# Patient Record
Sex: Female | Born: 1970 | Race: White | Hispanic: No | Marital: Married | Smoking: Former smoker
Health system: Southern US, Community
[De-identification: ages and names within clinical notes are randomized; demographics above are authoritative.]

## PROBLEM LIST (undated history)

## (undated) DIAGNOSIS — M5126 Other intervertebral disc displacement, lumbar region: Secondary | ICD-10-CM

## (undated) DIAGNOSIS — N390 Urinary tract infection, site not specified: Secondary | ICD-10-CM

## (undated) HISTORY — DX: Urinary tract infection, site not specified: N39.0

## (undated) HISTORY — PX: ABDOMINAL HYSTERECTOMY: SHX81

## (undated) HISTORY — DX: Other intervertebral disc displacement, lumbar region: M51.26

---

## 2007-12-22 HISTORY — PX: ABDOMINOPLASTY: SUR9

## 2015-09-22 ENCOUNTER — Emergency Department (HOSPITAL_COMMUNITY)
Admission: EM | Admit: 2015-09-22 | Discharge: 2015-09-22 | Disposition: A | Discharge status: Home / Self Care | Payer: Managed Care, Other (non HMO) | Attending: Emergency Medicine | Admitting: Emergency Medicine

## 2015-09-22 ENCOUNTER — Encounter (HOSPITAL_COMMUNITY): Payer: Self-pay | Admitting: Emergency Medicine

## 2015-09-22 ENCOUNTER — Emergency Department (HOSPITAL_COMMUNITY): Payer: Managed Care, Other (non HMO)

## 2015-09-22 DIAGNOSIS — R1012 Left upper quadrant pain: Secondary | ICD-10-CM | POA: Diagnosis not present

## 2015-09-22 DIAGNOSIS — R102 Pelvic and perineal pain: Secondary | ICD-10-CM | POA: Insufficient documentation

## 2015-09-22 DIAGNOSIS — R197 Diarrhea, unspecified: Secondary | ICD-10-CM | POA: Diagnosis not present

## 2015-09-22 DIAGNOSIS — R109 Unspecified abdominal pain: Secondary | ICD-10-CM

## 2015-09-22 LAB — COMPREHENSIVE METABOLIC PANEL
ALBUMIN: 4.4 g/dL (ref 3.5–5.0)
ALT: 14 U/L (ref 14–54)
ANION GAP: 8 (ref 5–15)
AST: 18 U/L (ref 15–41)
Alkaline Phosphatase: 42 U/L (ref 38–126)
BILIRUBIN TOTAL: 1.5 mg/dL — AB (ref 0.3–1.2)
BUN: 9 mg/dL (ref 6–20)
CALCIUM: 9.5 mg/dL (ref 8.9–10.3)
CO2: 29 mmol/L (ref 22–32)
Chloride: 102 mmol/L (ref 101–111)
Creatinine, Ser: 0.74 mg/dL (ref 0.44–1.00)
GFR calc non Af Amer: >60 mL/min (ref >60)
GLUCOSE: 96 mg/dL (ref 65–99)
POTASSIUM: 3.7 mmol/L (ref 3.5–5.1)
SODIUM: 139 mmol/L (ref 135–145)
TOTAL PROTEIN: 6.9 g/dL (ref 6.5–8.1)

## 2015-09-22 LAB — URINALYSIS, ROUTINE W REFLEX MICROSCOPIC
Bilirubin Urine: NEGATIVE
Glucose, UA: NEGATIVE mg/dL
Hgb urine dipstick: NEGATIVE
Ketones, ur: NEGATIVE mg/dL
LEUKOCYTES UA: NEGATIVE
NITRITE: NEGATIVE
PROTEIN: NEGATIVE mg/dL
SPECIFIC GRAVITY, URINE: 1.022 (ref 1.005–1.030)
UROBILINOGEN UA: 0.2 mg/dL (ref 0.0–1.0)
pH: 6 (ref 5.0–8.0)

## 2015-09-22 LAB — CBC WITH DIFFERENTIAL/PLATELET
BASOS ABS: 0 10*3/uL (ref 0.0–0.1)
BASOS PCT: 0 %
Eosinophils Absolute: 0.1 10*3/uL (ref 0.0–0.7)
Eosinophils Relative: 2 %
HEMATOCRIT: 41.6 % (ref 36.0–46.0)
HEMOGLOBIN: 14.1 g/dL (ref 12.0–15.0)
LYMPHS PCT: 35 %
Lymphs Abs: 1.7 10*3/uL (ref 0.7–4.0)
MCH: 30.6 pg (ref 26.0–34.0)
MCHC: 33.9 g/dL (ref 30.0–36.0)
MCV: 90.2 fL (ref 78.0–100.0)
MONO ABS: 0.5 10*3/uL (ref 0.1–1.0)
Monocytes Relative: 9 %
NEUTROS PCT: 54 %
Neutro Abs: 2.7 10*3/uL (ref 1.7–7.7)
Platelets: 242 10*3/uL (ref 150–400)
RBC: 4.61 MIL/uL (ref 3.87–5.11)
RDW: 12 % (ref 11.5–15.5)
WBC: 5 10*3/uL (ref 4.0–10.5)

## 2015-09-22 LAB — C DIFFICILE QUICK SCREEN W PCR REFLEX
C DIFFICILE (CDIFF) INTERP: NEGATIVE
C DIFFICLE (CDIFF) ANTIGEN: NEGATIVE
C Diff toxin: NEGATIVE

## 2015-09-22 LAB — LIPASE, BLOOD: Lipase: 22 U/L (ref 22–51)

## 2015-09-22 MED ORDER — CIPROFLOXACIN HCL 500 MG PO TABS: 500.0000 mg | ORAL_TABLET | Freq: Two times a day (BID) | ORAL | Status: DC

## 2015-09-22 MED ORDER — IOHEXOL 300 MG/ML  SOLN
25.0000 mL | Freq: Once | INTRAMUSCULAR | Status: AC | PRN
  Administered 2015-09-22: 25 mL via ORAL

## 2015-09-22 MED ORDER — METRONIDAZOLE 500 MG PO TABS
500.0000 mg | ORAL_TABLET | Freq: Three times a day (TID) | ORAL | Status: DC
Start: 2015-09-22 — End: 2016-06-23

## 2015-09-22 MED ORDER — IOHEXOL 300 MG/ML  SOLN
100.0000 mL | Freq: Once | INTRAMUSCULAR | Status: AC | PRN
  Administered 2015-09-22: 100 mL via INTRAVENOUS

## 2015-09-22 MED ORDER — SODIUM CHLORIDE 0.9 % IV SOLN
Freq: Once | INTRAVENOUS | Status: AC
  Administered 2015-09-22: 08:00:00 via INTRAVENOUS

## 2015-09-22 NOTE — Discharge Instructions (Signed)
You have come to the ED today for abdominal pain and diarrhea. It is suspected that you have an enteritis. You will be given antibiotics to treat this. You will be notified of your C-Diff results if they are positive.  A resource guide is included for you to choose a PCP as needed.   Please return to ED should symptoms worsen.

## 2015-09-22 NOTE — ED Notes (Signed)
Pt drinking oral contrast. Instructed to call when she is finished.

## 2015-09-22 NOTE — ED Notes (Signed)
Patient transported to CT 

## 2015-09-22 NOTE — ED Notes (Signed)
A week ago Thursday started having abd pain, pressure, bloating. In LUQ and extends to groin. Denies N/V, reports occasional diarrhea. Family MD started on Augmentin, no improvement.

## 2015-09-22 NOTE — ED Notes (Signed)
Patient returned from CT

## 2015-09-22 NOTE — ED Notes (Signed)
Finished with PO contrast.

## 2015-09-22 NOTE — ED Provider Notes (Signed)
CSN: [id number]     Arrival date & time 09/22/15  0709 History   First MD Initiated Contact with Patient 09/22/15 [phone]     Chief Complaint  Patient presents with  . Abdominal Pain     Patient is a 44 y.o. female presenting with abdominal pain. The history is provided by the patient.  Abdominal Pain Pain location:  LUQ Pain quality: bloating, cramping, fullness and gnawing   Pain radiates to:  Groin Pain severity:  Moderate Onset quality:  Sudden Duration:  11 days Timing:  Constant Progression:  Unchanged Chronicity:  New Context: trauma   Context: not awakening from sleep, not diet changes, not recent travel, not sick contacts and not suspicious food intake   Relieved by: Gas-X reduced bloating somewhat. Worsened by:  Eating Associated symptoms: diarrhea   Associated symptoms: no belching, no chest pain, no chills, no constipation, no cough, no dysuria, no fatigue, no fever, no hematemesis, no hematochezia, no hematuria, no nausea, no shortness of breath, no vaginal bleeding, no vaginal discharge and no vomiting     Danielle Kramer is a 44 y.o. female presents with LUQ abdominal pain that began Thursday, September 22 when she woke up. Pain extends down into the groin, is constant at about 4/10, and is accompanied by diarrhea and feelings of being bloated and "rumbling." Pt denies any N/V/D, hematochezia, abdominal distention, chest pain, shortness of breath, or any other complaints. Pt has a previous oophorectomy of her right ovary as well as a partial hysterectomy. Pt was seen on Sept 27 (Tues) at the [facility], saw the NP on call, and received a abdominal and pelvic ultrasound, which showed a normal left ovary as well as evidence of an inflamed colon. Pt was then seen by a primary care provider on Thursday, Sept 29, was diagnosed with "possible diverticulitis" and given Augmentin. After almost 4 days on Augmentin pt states she feels no better. Pt had a colonoscopy two years ago due to  some rectal bleeding. Colonoscopy was clear other than minor hemorrhoids.    History reviewed. No pertinent past medical history. Past Surgical History  Procedure Laterality Date  . Abdominal hysterectomy    . Abdominoplasty     History reviewed. No pertinent family history. Social History  Substance Use Topics  . Smoking status: Never Smoker   . Smokeless tobacco: None  . Alcohol Use: Yes   OB History    No data available     Review of Systems  Constitutional: Negative for fever, chills and fatigue.  Respiratory: Negative for cough, chest tightness and shortness of breath.   Cardiovascular: Negative for chest pain, palpitations and leg swelling.  Gastrointestinal: Positive for abdominal pain and diarrhea. Negative for nausea, vomiting, constipation, blood in stool, hematochezia, abdominal distention and hematemesis.  Genitourinary: Positive for pelvic pain. Negative for dysuria, urgency, hematuria, flank pain, decreased urine volume, vaginal bleeding, vaginal discharge, difficulty urinating and vaginal pain.      Allergies  Review of patient's allergies indicates no known allergies.  Home Medications   Prior to Admission medications   Medication Sig Start Date End Date Taking? Authorizing Provider  ciprofloxacin (CIPRO) 500 MG tablet Take 1 tablet (500 mg total) by mouth 2 (two) times daily. 09/22/15   Elija Mccamish C Alleigh Mollica, PA-C  metroNIDAZOLE (FLAGYL) 500 MG tablet Take 1 tablet (500 mg total) by mouth 3 (three) times daily. 09/22/15   Katelynne Revak C Renezmae Canlas, PA-C   BP 117/82 mmHg  Pulse 82  Temp(Src) 98.1 F (36.7  C) (Oral)  Resp 20  Ht  (1.727 m)  Wt 158 lb (71.668 kg)  BMI 24.03 kg/m2  SpO2 100% Physical Exam  Constitutional: She is oriented to person, place, and time. She appears well-developed and well-nourished. No distress.  HENT:  Head: Normocephalic and atraumatic.  Eyes: Conjunctivae and EOM are normal. Pupils are equal, round, and reactive to light.  Cardiovascular:  Normal rate, regular rhythm and normal heart sounds.   Pulmonary/Chest: Effort normal and breath sounds normal. No respiratory distress.  Abdominal: Soft. Normal appearance and bowel sounds are normal. She exhibits no distension and no mass. There is tenderness in the left upper quadrant. No hernia.  Very mild tenderness in LUQ with no guarding. No hernia appreciated.  Musculoskeletal: She exhibits no edema or tenderness.  Neurological: She is alert and oriented to person, place, and time.  Skin: Skin is warm and dry. She is not diaphoretic.  Nursing note and vitals reviewed.   ED Course  Procedures (including critical care time) Labs Review Labs Reviewed  COMPREHENSIVE METABOLIC PANEL - Abnormal; Notable for the following:    Total Bilirubin 1.5 (*)    All other components within normal limits  URINALYSIS, ROUTINE W REFLEX MICROSCOPIC (NOT AT [facility]) - Abnormal; Notable for the following:    Color, Urine AMBER (*)    APPearance CLOUDY (*)    All other components within normal limits  C DIFFICILE QUICK SCREEN W PCR REFLEX  CBC WITH DIFFERENTIAL/PLATELET  LIPASE, BLOOD    Imaging Review Ct Abdomen Pelvis W Contrast  09/22/2015   CLINICAL DATA:  Ten day history of left left-sided abdominal pain and pressure. Intermittent diarrhea.  EXAM: CT ABDOMEN AND PELVIS WITH CONTRAST  TECHNIQUE: Multidetector CT imaging of the abdomen and pelvis was performed using the standard protocol following bolus administration of intravenous contrast. Oral contrast was also administered.  CONTRAST:  OMNIPAQUE IOHEXOL 300 MG/ML  SOLN  COMPARISON:  None.  FINDINGS: Lower chest: There is slight bibasilar lung atelectatic change. Lung bases are otherwise clear.  Hepatobiliary: There is a 1.6 x 1.6 cm mass in the dome of the liver in the medial segment left lobe region, likely a hemangioma. There is a second mass in the liver, more inferiorly located in the lateral segment left lobe measuring 1.4 x 1.0 cm.  This mass appears unchanged on slightly delayed images compared to the venous phase images initially obtained. No other focal liver lesions are identified. Gallbladder wall is not thickened. There is no biliary duct dilatation.  Pancreas: No mass or inflammatory change.  Spleen: No lesion identified.  Adrenals/Urinary Tract: Adrenals appear normal bilaterally. Kidneys bilaterally show no mass or hydronephrosis on either side. There is no renal or ureteral calculus on either side. Urinary bladder is midline with normal wall thickness.  Stomach/Bowel: Most bowel loops are fluid filled. There is no bowel wall or mesenteric thickening. No bowel obstruction. No free air or portal venous air. There are scattered sigmoid and descending colonic diverticula without diverticulitis.  Vascular/Lymphatic: There is no abdominal aortic aneurysm. There is no adenopathy in the abdomen or pelvis.  Reproductive: Uterus by report is absent. There is prominence in the region of the cervix with fluid either in the cervix or in a residual lower uterine segmental remnant. There is no well-defined pelvic mass or pelvic fluid collection.  Other: Appendix appears unremarkable. No abscess or ascites in the abdomen or pelvis. There is a rather minimal ventral hernia containing only fat.  Musculoskeletal: There  are no blastic or lytic bone lesions. No intramuscular lesion or abdominal wall lesion.  IMPRESSION: Fluid either within the endocervical canal or within a lower uterine segmental remnant. Correlation with pelvic examination advised given this appearance.  Most bowel loops are fluid-filled. This finding may be seen normally but also may be seen with a degree of enteritis. Note that there is no bowel wall or mesenteric thickening. No bowel obstruction.  Liver lesions, most likely representing hemangiomas. As there are no prior studies to compare, it may be reasonable to correlate with pre and serial post-contrast CT or MR to further  evaluate these lesions. MR would be the optimum study for this evaluation.  No renal or ureteral calculus.  No hydronephrosis.  There is a minimal ventral hernia containing only fat.   Electronically Signed   By: Bretta Bang III M.D.   On: 09/22/2015 10:22   I have personally reviewed and evaluated these images and lab results as part of my medical decision-making.   EKG Interpretation None      MDM   Final diagnoses:  Abdominal pain in female  Diarrhea, unspecified type    Danielle Kramer presents with LUQ abdominal pain for greater than a week without fever, N/V, or bleeding.  Findings and plan of care discussed with Dr. Anitra Lauth.  Will obtain abdominal and pelvic CT, labs, and monitor pain. Pt offered pain medication, but declined. Pt states she will advise provider should she change her mind.  Unless imaging results indicate otherwise, pt will be changed from Augmentin to a combination of Cipro and Flagyl.  8:23 AM CBC shows no evidence of acute hemorrhage or systemic infection. Pt still denies need for pain medication.  9:08 AM CMP and lipase both without abnormality.  9:23 AM Pt states her pain is now only limited to LUQ, is still about a 4/10, and declines pain medication. Pt has finished oral contrast and is awaiting CT.  10:30 AM CT shows indications of possible enteritis with no obstruction, masses, bowel wall thickening, or diverticulitis. Hepatic hemangiomas noted. Recommended pre and serial post-contrast CT or MR to further evaluate lesions and give comparison. MR is the recommended study for this problem.   Will communicate this to patient and advise that she followup with PCP to have these hemangiomas evaluated and monitored.  Pt states her son had C-Diff in April, but had no other exposures to C-Diff. Pt will try to give a stool sample.  Pt able to give stool sample. Will discharge patient and followup with results via phone call.  Anselm Pancoast,  PA-C 09/22/15 1138  Geoffery Lyons, MD 09/22/15 2258

## 2016-06-04 ENCOUNTER — Ambulatory Visit (INDEPENDENT_AMBULATORY_CARE_PROVIDER_SITE_OTHER): Payer: PRIVATE HEALTH INSURANCE | Admitting: Sports Medicine

## 2016-06-04 ENCOUNTER — Encounter: Payer: Self-pay | Admitting: Sports Medicine

## 2016-06-04 VITALS — BP 120/83 | HR 85 | Ht 68.0 in | Wt 165.0 lb

## 2016-06-04 DIAGNOSIS — R5383 Other fatigue: Secondary | ICD-10-CM

## 2016-06-04 MED ORDER — AMITRIPTYLINE HCL 25 MG PO TABS: 25.0000 mg | ORAL_TABLET | Freq: Every day | ORAL | Status: DC

## 2016-06-04 NOTE — Progress Notes (Signed)
  Danielle Kramer - 45 y.o. female MRN [id number]  Date of birth: 04/16/1971 Danielle ConroyKari Kramer is a 45 y.o. female who presents today for joint pain.  Polyarthralgia, initial visit, 06/04/16-patient presents today with ongoing polyarthralgias for about 1 year. She was an avid [employer]/shoulder prior to this with no joint pain. When she stopped this about 1 year ago she started developing polyarthralgias in her knees ankles and's hips shoulders. She denies any changes in her medications travel or sick contacts or being sick at that time. Denies any fevers chills night sweats or unintentional weight loss. This fluctuated over the last several months and she has seen 2 previous doctors who have done multiple labs on her including sedimentation rate, CRP, comprehensive metabolic profile, thyroid-stimulating hormone, CBC, ANA, rheumatoid factor, anti-CCP, estrogen and testosterone, CK all of which have been normal 2. Her joint pain at this point is not limiting but she has trouble sleeping. No headaches blurred vision or double vision. She has not tried medication at this point for this.  PMHx - Updated and reviewed.  Contributory factors include: Negative PSHx - Updated and reviewed.  Contributory factors include:  Abdominal hysterectomy FHx - Updated and reviewed.  Contributory factors include:  Negative Social Hx - Updated and reviewed. Contributory factors include: Nonsmoker  Medications - reviewed   ROS Per HPI.  12 point negative other than per HPI.   Exam:  Filed Vitals:   06/04/16 0954  BP: 120/83  Pulse: 85   Gen: NAD, AAO 3 Cardio- RRR Pulm - Normal respiratory effort/rate Skin: No rashes or erythema Extremities: No edema  Vascular: pulses +2 bilateral upper and lower extremity Psych: Normal affect  MSK: No joint effusions noted. No erythema or ecchymosis. She has full range of motion of all joints with no tenderness to palpation. Grossly neurovascularly intact bilateral upper extremity and lower  extremity.  Imaging:  None

## 2016-06-04 NOTE — Assessment & Plan Note (Signed)
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 Trial of Elavil along with anti-inflammatory nutrition and light training. She will follow-up in 6-8 weeks to see how she is doing.

## 2016-06-17 ENCOUNTER — Encounter: Payer: Self-pay | Admitting: Neurology

## 2016-06-17 ENCOUNTER — Ambulatory Visit (INDEPENDENT_AMBULATORY_CARE_PROVIDER_SITE_OTHER): Payer: PRIVATE HEALTH INSURANCE | Admitting: Neurology

## 2016-06-17 VITALS — BP 128/86 | HR 79 | Ht 68.0 in | Wt 164.0 lb

## 2016-06-17 DIAGNOSIS — R251 Tremor, unspecified: Secondary | ICD-10-CM | POA: Diagnosis not present

## 2016-06-17 MED ORDER — AMITRIPTYLINE HCL 10 MG PO TABS: 10.0000 mg | ORAL_TABLET | Freq: Every day | ORAL | Status: DC

## 2016-06-17 NOTE — Progress Notes (Signed)
Reason for visit: Tremor  Referring physician: Dr. Lucilla LameWatson  Danielle Kramer is a 45 y.o. female  History of present illness:  Danielle Kramer is a 45 year old right-handed white female with a history of onset of total body burning sensations that began in early April 2017. The patient felt very poorly for about 7 days, then the burning sensations seemed to clear, and the patient began having popping sensations and joint discomfort, particularly in the hips and shoulders. The patient denied any fevers, chills, skin rashes, or joint swellings. The patient has had some internal sensations of tremor that are most notable when she is using the arms or legs, particularly with putting pressure down on something with her hands. The patient denies any true weakness. She denies issues with balance or trouble controlling the bowels or the bladder. The patient did have some headaches and neck discomfort early on in the clinical syndrome, but this has resolved. The patient has been seen by several doctors, she has had extensive blood work done to include a comprehensive metabolic profile, iron panel, CBC, C-reactive protein, sedimentation rate, thyroid profile, thyroid peroxidase antibody, thyroglobulin antibody, vitamin B12 level, ANA, rheumatoid factor, Lyme antibody panel, vitamin D level, and testosterone level. These studies have all been normal. The patient is trying to get back into exercise slowly. She was exercising vigorously prior to the onset of symptoms, the patient had stopped exercising suddenly and then the symptoms came on. The patient is sent to this office for further evaluation.  Past Medical History  Diagnosis Date  . Lumbar herniated disc     L4/5  . Frequent UTI     Past Surgical History  Procedure Laterality Date  . Abdominal hysterectomy  2008, 2010  . Abdominoplasty  2009    Family History  Problem Relation Age of Onset  . Crohn's disease Father   . Bladder Cancer Father   .  Hepatitis Mother     Autoimmune  . Hyperthyroidism Mother     Social history:  reports that she quit smoking about 28 years ago. She does not have any smokeless tobacco history on file. She reports that she drinks alcohol. She reports that she does not use illicit drugs.  Medications:  Prior to Admission medications   Medication Sig Start Date End Date Taking? Authorizing Provider  Cholecalciferol (VITAMIN D PO) Take by mouth daily.   Yes Historical Provider, MD  ibuprofen (ADVIL,MOTRIN) 800 MG tablet Take 800 mg by mouth.   Yes Historical Provider, MD  Omega-3 Fatty Acids (FISH OIL PO) Take by mouth daily.   Yes Historical Provider, MD  amitriptyline (ELAVIL) 10 MG tablet Take 1 tablet (10 mg total) by mouth at bedtime. 06/17/16   York Spanielharles K Candance Bohlman, MD  ciprofloxacin (CIPRO) 500 MG tablet Take 1 tablet (500 mg total) by mouth 2 (two) times daily. Patient not taking: Reported on 06/17/2016 09/22/15   Shawn C Joy, PA-C  metroNIDAZOLE (FLAGYL) 500 MG tablet Take 1 tablet (500 mg total) by mouth 3 (three) times daily. Patient not taking: Reported on 06/17/2016 09/22/15   Hillard DankerShawn C Joy, PA-C  nitrofurantoin, macrocrystal-monohydrate, (MACROBID) 100 MG capsule Take 100 mg by mouth. Reported on 06/17/2016    Historical Provider, MD     No Known Allergies  ROS:  Out of a complete 14 system review of symptoms, the patient complains only of the following symptoms, and all other reviewed systems are negative.  Feeling hot, cold Joint pain, achy muscles Insomnia  Blood pressure  128/86, pulse 79, height 5\' 8"  (1.727 m), weight 164 lb (74.39 kg).  Physical Exam  General: The patient is alert and cooperative at the time of the examination.  Eyes: Pupils are equal, round, and reactive to light. Discs are flat bilaterally.  Neck: The neck is supple, no carotid bruits are noted.  Respiratory: The respiratory examination is clear.  Cardiovascular: The cardiovascular examination reveals a regular rate  and rhythm, no obvious murmurs or rubs are noted.  Skin: Extremities are without significant edema.  Neurologic Exam  Mental status: The patient is alert and oriented x 3 at the time of the examination. The patient has apparent normal recent and remote memory, with an apparently normal attention span and concentration ability.  Cranial nerves: Facial symmetry is present. There is good sensation of the face to pinprick and soft touch bilaterally. The strength of the facial muscles and the muscles to head turning and shoulder shrug are normal bilaterally. Speech is well enunciated, no aphasia or dysarthria is noted. Extraocular movements are full. Visual fields are full. The tongue is midline, and the patient has symmetric elevation of the soft palate. No obvious hearing deficits are noted.  Motor: The motor testing reveals 5 over 5 strength of all 4 extremities. Good symmetric motor tone is noted throughout.  Sensory: Sensory testing is intact to pinprick, soft touch, vibration sensation, and position sense on all 4 extremities. No evidence of extinction is noted.  Coordination: Cerebellar testing reveals good finger-nose-finger and heel-to-shin bilaterally.  Gait and station: Gait is normal. Tandem gait is normal. Romberg is negative. No drift is seen.  Reflexes: Deep tendon reflexes are symmetric and normal bilaterally. Toes are downgoing bilaterally.   Assessment/Plan:  1. Subjective tremor  2. Arthralgias  The patient reports some difficulty with a sensation of internal tremors, she does have some anxiety associated with this. She is not sleeping well at night because of the feeling of tremor. The etiology of her clinical syndrome is not clear. The patient has had extensive blood work evaluation that has been completely normal, and her clinical examination is normal. The patient is concerned she may have demyelinating disease, I think this is not likely, but we will pursue MRI of the  brain at this time. If this is normal, I would not pursue any further neurologic workup. The patient has been placed on amitriptyline at 25 mg at night, we will actually start at 10 mg at night and increase the dose slowly. The patient may feel better if she is able to sleep well. I would encourage her to get back into exercise slowly.  Marlan Palau. Keith Ferrell Flam MD 06/17/2016 6:45 PM  [facility] [address] [place] [id number]  Phone [phone] Fax [phone]

## 2016-06-18 ENCOUNTER — Telehealth: Payer: Self-pay

## 2016-06-18 NOTE — Telephone Encounter (Signed)
-----   Message from York Spanielharles K Willis, MD sent at 06/17/2016  6:49 PM EDT ----- Please fax the NP note on this patient to Dr. Claudette LawsWatson at [phone], Dr. Gerald StabsKaitlyn Watson. Thanks She is not in the EPIC system.

## 2016-06-18 NOTE — Telephone Encounter (Signed)
Faxed as requested

## 2016-06-23 ENCOUNTER — Encounter: Payer: Self-pay | Admitting: *Deleted

## 2016-06-23 ENCOUNTER — Emergency Department
Admission: EM | Admit: 2016-06-23 | Discharge: 2016-06-23 | Disposition: A | Discharge status: Home / Self Care | Payer: PRIVATE HEALTH INSURANCE | Source: Home / Self Care | Attending: Family Medicine | Admitting: Family Medicine

## 2016-06-23 DIAGNOSIS — R319 Hematuria, unspecified: Secondary | ICD-10-CM | POA: Diagnosis not present

## 2016-06-23 LAB — POCT URINALYSIS DIP (MANUAL ENTRY)
Bilirubin, UA: NEGATIVE
Glucose, UA: NEGATIVE
Ketones, POC UA: NEGATIVE
LEUKOCYTES UA: NEGATIVE
NITRITE UA: NEGATIVE
PH UA: 6 (ref 5–8)
Spec Grav, UA: 1.01 (ref 1.005–1.03)
Urobilinogen, UA: 0.2 (ref 0–1)

## 2016-06-23 MED ORDER — CEPHALEXIN 500 MG PO CAPS
500.0000 mg | ORAL_CAPSULE | Freq: Two times a day (BID) | ORAL | Status: DC
Start: 2016-06-23 — End: 2016-08-19

## 2016-06-23 NOTE — ED Provider Notes (Signed)
CSN: [id number]     Arrival date & time 06/23/16  1749 History   First MD Initiated Contact with Patient 06/23/16 1911     Chief Complaint  Patient presents with  . Hematuria      HPI Comments: Patient has a history of recurring cystitis.  She takes empiric Macrobid prior to intercourse.  Last night and today she has noticed that her urine is very dark, and she has mild suprapubic pressure and urinary urgency without dysuria.  No back ache.  No fevers, chills, and sweats.  No nausea/vomiting.  She feels well otherwise. Family History is significant for bladder cancer in her father.  Patient is a 45 y.o. female presenting with hematuria. The history is provided by the patient.  Hematuria This is a recurrent problem. The current episode started yesterday. The problem occurs constantly. The problem has not changed since onset.Pertinent negatives include no abdominal pain and no headaches. Nothing aggravates the symptoms. Nothing relieves the symptoms. Treatments tried: Macrobid. The treatment provided no relief.    Past Medical History  Diagnosis Date  . Lumbar herniated disc     L4/5  . Frequent UTI    Past Surgical History  Procedure Laterality Date  . Abdominal hysterectomy  2008, 2010  . Abdominoplasty  2009   Family History  Problem Relation Age of Onset  . Crohn's disease Father   . Bladder Cancer Father   . Hepatitis Mother     Autoimmune  . Hyperthyroidism Mother    Social History  Substance Use Topics  . Smoking status: Former Smoker    Quit date: 06/17/1988  . Smokeless tobacco: None  . Alcohol Use: 0.0 oz/week    0 Standard drinks or equivalent per week     Comment: 1-2 per wk   OB History    No data available     Review of Systems  Constitutional: Negative for fever, chills, diaphoresis, activity change and fatigue.  Respiratory: Negative.   Cardiovascular: Negative.   Gastrointestinal: Negative for nausea, vomiting and abdominal pain.  Genitourinary:  Positive for urgency and hematuria. Negative for dysuria, frequency, flank pain, decreased urine volume, vaginal bleeding, vaginal discharge, vaginal pain and pelvic pain.  Musculoskeletal: Negative.  Negative for back pain.  Skin: Negative.   Neurological: Negative for headaches.    Allergies  Review of patient's allergies indicates no known allergies.  Home Medications   Prior to Admission medications   Medication Sig Start Date End Date Taking? Authorizing Provider  cephALEXin (KEFLEX) 500 MG capsule Take 1 capsule (500 mg total) by mouth 2 (two) times daily. 06/23/16   Lattie HawStephen A Beese, MD  Cholecalciferol (VITAMIN D PO) Take by mouth daily.    Historical Provider, MD  nitrofurantoin, macrocrystal-monohydrate, (MACROBID) 100 MG capsule Take 100 mg by mouth. Reported on 06/17/2016    Historical Provider, MD  Omega-3 Fatty Acids (FISH OIL PO) Take by mouth daily.    Historical Provider, MD   Meds Ordered and Administered this Visit  Medications - No data to display  BP 126/84 mmHg  Pulse 87  Temp(Src) 98 F (36.7 C) (Oral)  Resp 18  Ht 5\' 8"  (1.727 m)  Wt 164 lb (74.39 kg)  BMI 24.94 kg/m2  SpO2 100% No data found.   Physical Exam Nursing notes and Vital Signs reviewed. Appearance:  Patient appears stated age, and in no acute distress.    Eyes:  Pupils are equal, round, and reactive to light and accomodation.  Extraocular movement is intact.  Conjunctivae are not inflamed   Pharynx:  Normal; moist mucous membranes  Neck:  Supple.  No adenopathy Lungs:  Clear to auscultation.  Breath sounds are equal.  Moving air well. Heart:  Regular rate and rhythm without murmurs, rubs, or gallops.  Abdomen:   Mild tenderness over bladder without masses or hepatosplenomegaly.  Bowel sounds are present.  No CVA or flank tenderness.  Extremities:  No edema.  Skin:  No rash present.    ED Course  Procedures none    Labs Reviewed  POCT URINALYSIS DIP (MANUAL ENTRY) - Abnormal; Notable for  the following:    Clarity, UA cloudy (*)    Blood, UA large (*)    Protein Ur, POC =30 (*)    All other components within normal limits  URINE CULTURE      MDM   1. Hematuria; ?cystitis    Note family history of bladder cancer (father) Urine culture pending. Begin empiric Keflex 500mg  BID Continue increased fluid intake. Followup with urologist if not improved one week or if urine culture negative    Lattie HawStephen A Beese, MD 06/23/16 [phone]

## 2016-06-23 NOTE — ED Notes (Signed)
Pt c/o hematuria and dark colored urine x 1 day. Denies fever or dysuria. She reports a hx of UTI's.

## 2016-06-23 NOTE — Discharge Instructions (Signed)
Continue increased fluid intake.   Hematuria, Adult Hematuria is blood in your urine. It can be caused by a bladder infection, kidney infection, prostate infection, kidney stone, or cancer of your urinary tract. Infections can usually be treated with medicine, and a kidney stone usually will pass through your urine. If neither of these is the cause of your hematuria, further workup to find out the reason may be needed. It is very important that you tell your health care provider about any blood you see in your urine, even if the blood stops without treatment or happens without causing pain. Blood in your urine that happens and then stops and then happens again can be a symptom of a very serious condition. Also, pain is not a symptom in the initial stages of many urinary cancers. HOME CARE INSTRUCTIONS   Drink lots of fluid, 3-4 quarts a day. If you have been diagnosed with an infection, cranberry juice is especially recommended, in addition to large amounts of water.  Avoid caffeine, tea, and carbonated beverages because they tend to irritate the bladder.  Avoid alcohol because it may irritate the prostate.  Take all medicines as directed by your health care provider.  If you were prescribed an antibiotic medicine, finish it all even if you start to feel better.  If you have been diagnosed with a kidney stone, follow your health care provider's instructions regarding straining your urine to catch the stone.  Empty your bladder often. Avoid holding urine for long periods of time.  After a bowel movement, women should cleanse front to back. Use each tissue only once.  Empty your bladder before and after sexual intercourse if you are a female. SEEK MEDICAL CARE IF:  You develop back pain.  You have a fever.  You have a feeling of sickness in your stomach (nausea) or vomiting.  Your symptoms are not better in 3 days. Return sooner if you are getting worse. SEEK IMMEDIATE MEDICAL CARE  IF:   You develop severe vomiting and are unable to keep the medicine down.  You develop severe back or abdominal pain despite taking your medicines.  You begin passing a large amount of blood or clots in your urine.  You feel extremely weak or faint, or you pass out. MAKE SURE YOU:   Understand these instructions.  Will watch your condition.  Will get help right away if you are not doing well or get worse.   This information is not intended to replace advice given to you by your health care provider. Make sure you discuss any questions you have with your health care provider.   Document Released: 12/07/2005 Document Revised: 12/28/2014 Document Reviewed: 08/07/2013 Elsevier Interactive Patient Education [employer]2016 [employer] Inc.

## 2016-06-25 LAB — URINE CULTURE
Colony Count: NO GROWTH
Organism ID, Bacteria: NO GROWTH

## 2016-06-26 ENCOUNTER — Telehealth: Payer: Self-pay | Admitting: *Deleted

## 2016-06-26 NOTE — ED Notes (Signed)
Callback: UCX results given, patient reports she is improving.

## 2016-07-04 ENCOUNTER — Ambulatory Visit
Admission: RE | Admit: 2016-07-04 | Discharge: 2016-07-04 | Disposition: A | Discharge status: Home / Self Care | Payer: PRIVATE HEALTH INSURANCE | Source: Ambulatory Visit | Attending: Neurology | Admitting: Neurology

## 2016-07-04 DIAGNOSIS — R251 Tremor, unspecified: Secondary | ICD-10-CM | POA: Diagnosis not present

## 2016-07-06 ENCOUNTER — Telehealth: Payer: Self-pay | Admitting: Neurology

## 2016-07-06 NOTE — Telephone Encounter (Signed)
I called the patient. MRI the brain was unremarkable.   MRI brain 07/05/16:  IMPRESSION: This is a normal age-appropriate MRI of the brain. Incidental note is made of a single punctate T2/FLAIR hyperintense focus in the subcortical white matter of the right frontal lobe of doubtful significance. No acute findings.

## 2016-07-09 ENCOUNTER — Ambulatory Visit: Payer: PRIVATE HEALTH INSURANCE | Admitting: Sports Medicine

## 2016-08-19 ENCOUNTER — Ambulatory Visit (INDEPENDENT_AMBULATORY_CARE_PROVIDER_SITE_OTHER): Payer: PRIVATE HEALTH INSURANCE | Admitting: Sports Medicine

## 2016-08-19 ENCOUNTER — Encounter: Payer: Self-pay | Admitting: Sports Medicine

## 2016-08-19 DIAGNOSIS — F411 Generalized anxiety disorder: Secondary | ICD-10-CM

## 2016-08-19 DIAGNOSIS — R5383 Other fatigue: Secondary | ICD-10-CM | POA: Diagnosis not present

## 2016-08-19 MED ORDER — ALPRAZOLAM 0.25 MG PO TABS: 0.2500 mg | ORAL_TABLET | Freq: Two times a day (BID) | ORAL | 1 refills | Status: DC | PRN

## 2016-08-19 NOTE — Assessment & Plan Note (Signed)
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  Inadequate rest is clearly part of process

## 2016-08-19 NOTE — Assessment & Plan Note (Signed)
She has some classic panic features  Trial with taking edge off using some xanax  Future - consider counseling/ gradual changes of meds

## 2016-08-19 NOTE — Progress Notes (Signed)
Ms. Waskey is a 45 year old Caucasian female, who presents to the Sports Medicine clinic today for follow up on postexertional fatigue. In review, she was seen in the office approximately 2 months ago for generalized pain in B/L shoulders, arms, and legs. This had been occurring the previous year, mainly noticed it after stopping cross fit. Physical exam was unrevealing and lab work was ordered to rule out any rheumatologic causes. Fortunately, all lab work, including CBC, CMP, CRP, ESR, CK, ANA, RF, and anti-CCP were all negative. She was prescribed amitriptyline as she had been having issues with insomnia, related to menopausal changes and hysterectomy 10 years prior. She reports she only took one dose, had numb mouth. She was already nervous about amitriptyline as her sister overdosed on it. She reports that she is only sleeping about 5 hours a day. Main trigger keeping her up is hot flashes. Otherwise sleeps well. Does report she does have a lot of situational stress.   Soc. Hx: She takes care of both parents, father is in wheelchair, sister who overdoses on amitriptyline, and two 46 year old boys. All live at home with her. She is married and feels she has support from her husband. She occasionally takes some his Xanax. Reports has been increasing gym workouts, doing mainly core and weight exercises. She reports 3 weeks ago self-medicating with prednisone 5 mg daily for approximately 10-12 days. Reports "quivering" in bilateral shoulders, upper arms, and pectoralis muscles. Had some in inner thighs, but not as bad.  ROS: No fevers, chills Some situational stress and anxiety, no depression or SI No numbness or tingling in her arms or legs Denies any suicidal thoughts  Physical exam: Appears well developed and well nourished, NAD, appears stated age, normal weight Positive affect and insight  BP 121/86   Pulse 69   Ht '5\' 8"'$  (1.727 m)   Wt 164 lb (74.4 kg)   BMI 24.94 kg/m   Normal  respirations Normal peripheral pulses  MSK:  No warmth, erythema, or effusion noted in any major joint No TTP in B/L shoulders, in clavicle, AC joint, biceps, or scapula Do feel slight crepitus in right wrist with pronation and supination, but no TTP on B/L wrists No TTP in B/L knees or ankles Full ROM in B/L shoulders and knees  AP:  Situational stress and anxiety leading to chronic fatigue syndrome -Discussed no explainable medical causes to her symptoms given normal labwork -No focal physical exam findings to explain her symptoms -After further psychologic evaluation, she does have a tremendous amount of caregiver responsibility and stressors, discussed taxing energy as well as increasing workout she has been doing -For situational stress and panic disorder, will write for Xanax 0.25 mg to be given up to twice daily as needed, discussed at least taking it once daily to break panic cycle -Low key exercises, walking -RTC in 1 month for follow up

## 2016-09-16 ENCOUNTER — Encounter: Payer: Self-pay | Admitting: Sports Medicine

## 2016-09-16 ENCOUNTER — Other Ambulatory Visit: Payer: Self-pay | Admitting: *Deleted

## 2016-09-16 ENCOUNTER — Ambulatory Visit (INDEPENDENT_AMBULATORY_CARE_PROVIDER_SITE_OTHER): Payer: PRIVATE HEALTH INSURANCE | Admitting: Sports Medicine

## 2016-09-16 DIAGNOSIS — F411 Generalized anxiety disorder: Secondary | ICD-10-CM

## 2016-09-16 DIAGNOSIS — R5383 Other fatigue: Secondary | ICD-10-CM | POA: Diagnosis not present

## 2016-09-16 MED ORDER — ALPRAZOLAM 0.25 MG PO TABS: 0.2500 mg | ORAL_TABLET | Freq: Two times a day (BID) | ORAL | 2 refills | Status: AC | PRN

## 2016-09-16 NOTE — Assessment & Plan Note (Signed)
Somewhat improved with better sleep  Needs to monitor schedule and reduce stress level  Reintroduce some easy aerobic activity on days not lifitng

## 2016-09-16 NOTE — Assessment & Plan Note (Signed)
Cont on Xanax at HS  Made worse with menopausal sxs  Reassured that recent reactions more related to menopause / not allergic to macrobid  Reck 2 mos

## 2016-09-16 NOTE — Progress Notes (Signed)
CC: Muscle "quivering"  Sleeping better on 1 xanax per hs Hot flashes - twice nightly waken her but not mm pain  Exercise Gym with weight 3 to 4x per week Step aerobics 1 x per week  Still with mm quivering and tightness at times Worse with prolonged computer work  Soc Hx Busy with parents and issues (father in wheelchair) Kid schedules activity daily Work to [employer] practice to dinner at 9 PM to clean up sleep by 10:30 then exercise class at 5 to 6 AM  ROS Flushing at times with hot flashes No numbness in arms No sciatic sxs or LBP  PE Muscular F in NAD BP 106/77   Pulse 79   Ht 5\' 8"  (1.727 m)   Wt 162 lb (73.5 kg)   BMI 24.63 kg/m   Neuro exam : normal strength/ CN intact No numbness Provocative tests and Tinel's neg No tremor noted today  Mil trap spasm left  Shoulder exam and strenth normal  Heel and toe walk normal

## 2016-09-16 NOTE — Patient Instructions (Signed)
Continue to work on your stress issues Use xanax at night If you have a bad flare take a 1/2 up to a couple of times during day  Start adding some aerobic activity as tiolerated  Supplement tart cherry juice - 4 to 8 ounces on days you do strength work  Add Vit C 500 mgm per day Add b complex vitamin  Work with this plan and see me in a couple months

## 2016-09-29 IMAGING — CT CT ABD-PELV W/ CM
2 of 5 series · 10 of 46 positions shown, 11 images · IV contrast (Iodine)
Comparison: None.

CLINICAL DATA: Ten day history of left left-sided abdominal pain
and pressure. Intermittent diarrhea.

EXAM:
CT ABDOMEN AND PELVIS WITH CONTRAST
TECHNIQUE: Multidetector CT imaging of the abdomen and pelvis was performed
using the standard protocol following bolus administration of
intravenous contrast. Oral contrast was also administered.
CONTRAST:  100mL OMNIPAQUE IOHEXOL 300 MG/ML  SOLN

[Series 201: routine, idose (2) · axial · 0.78mm/px · z∈[+22,+367]mm · 7 of 91 slices shown, 8 images]
[im 11/91  soft-tissue]
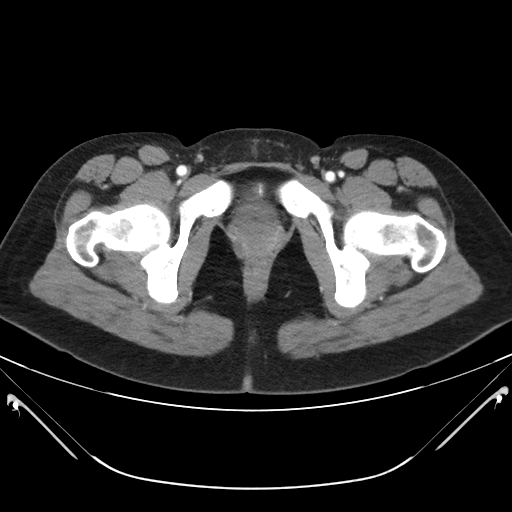
[im 11/91  bone]
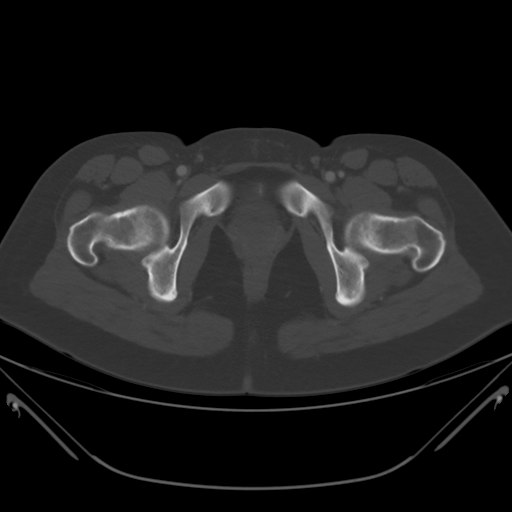
[im 22/91  soft-tissue]
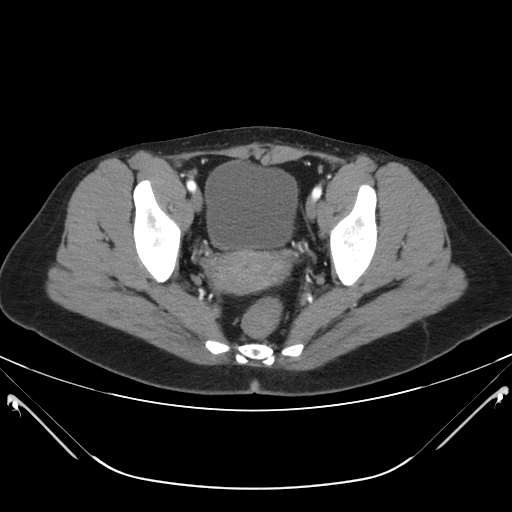
[im 32/91  soft-tissue]
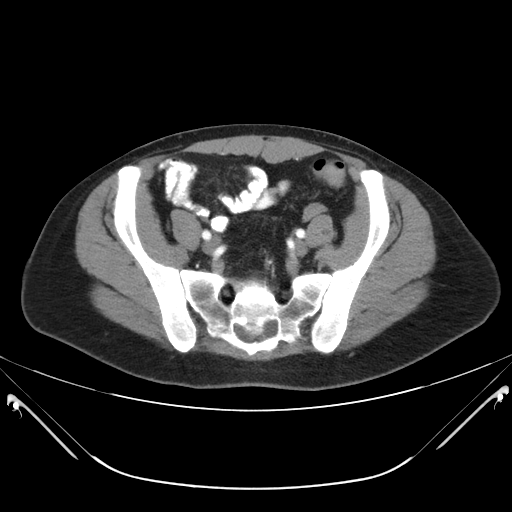
[im 48/91  soft-tissue]
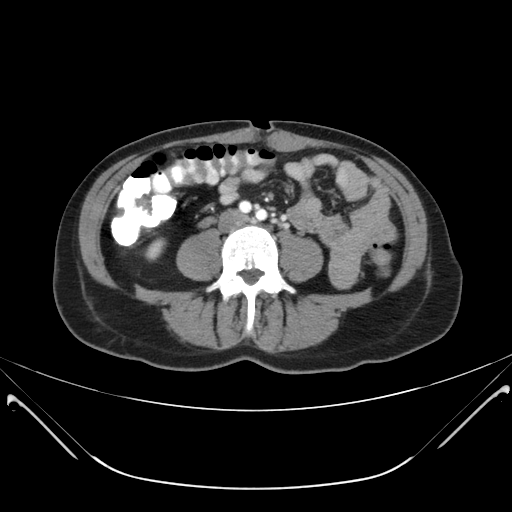
[im 59/91  soft-tissue]
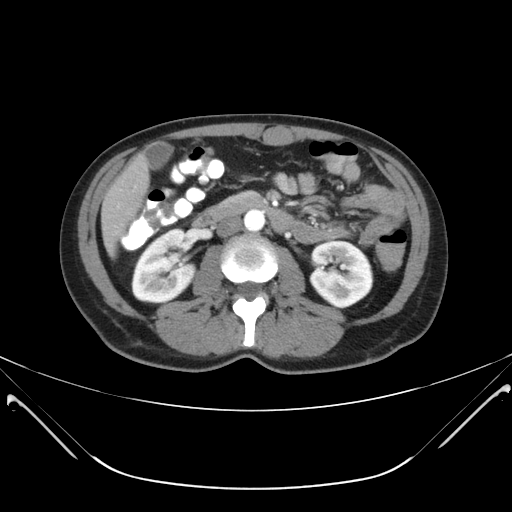
[im 69/91  soft-tissue]
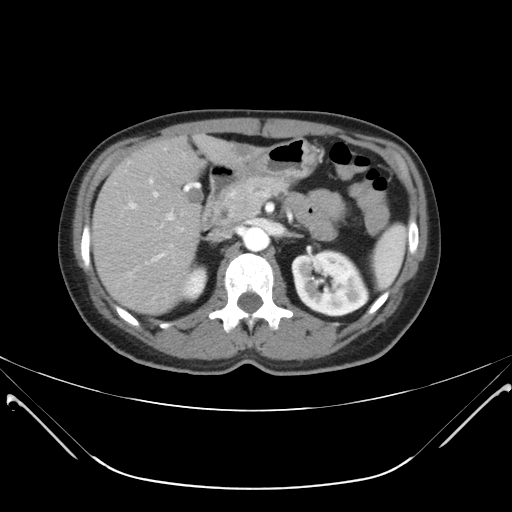
[im 80/91  soft-tissue]
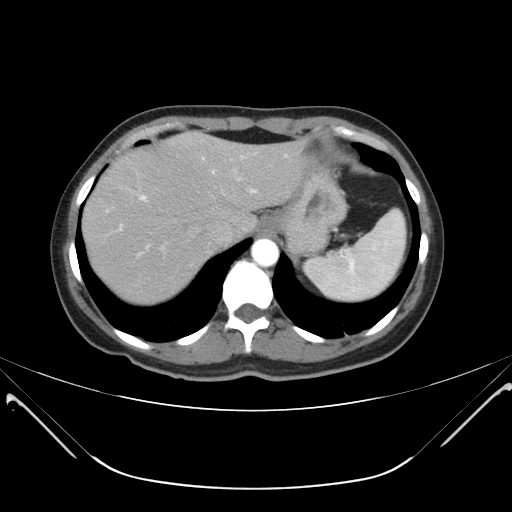

[Series 203: coronals, idose (2) · coronal · 0.45mm/px · 3 of 87 slices shown]
[im 29/87  soft-tissue]
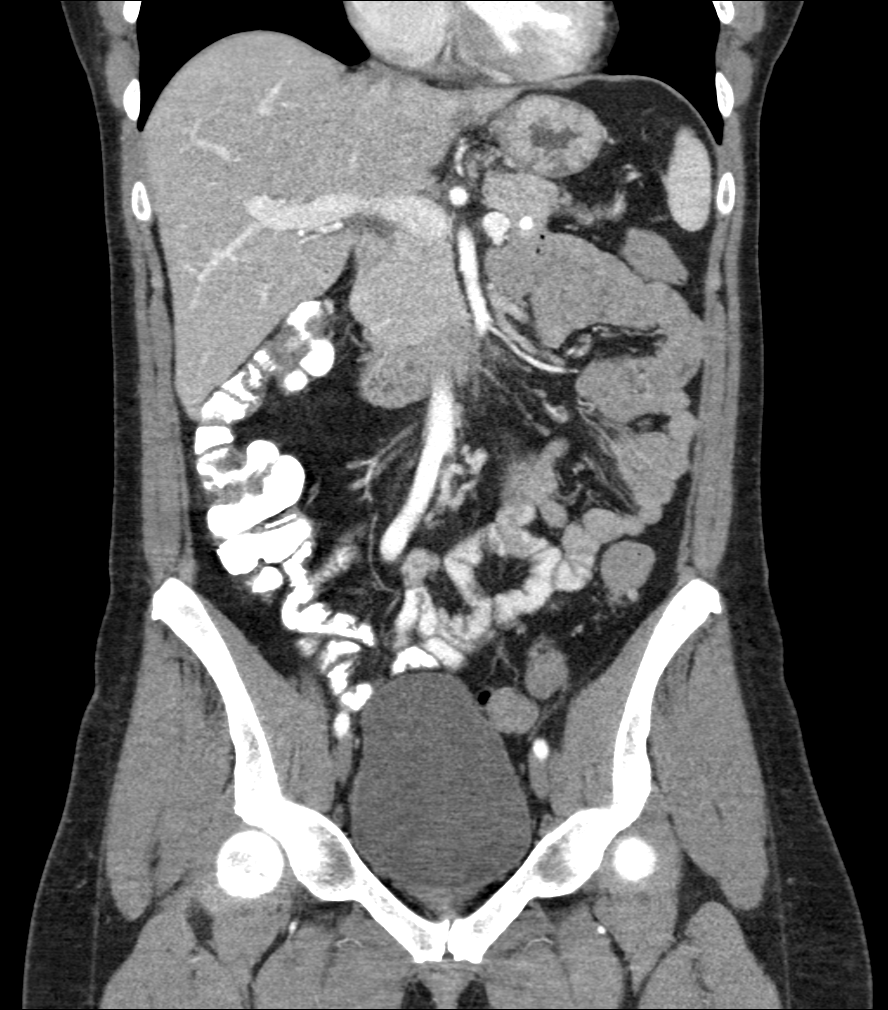
[im 39/87  soft-tissue]
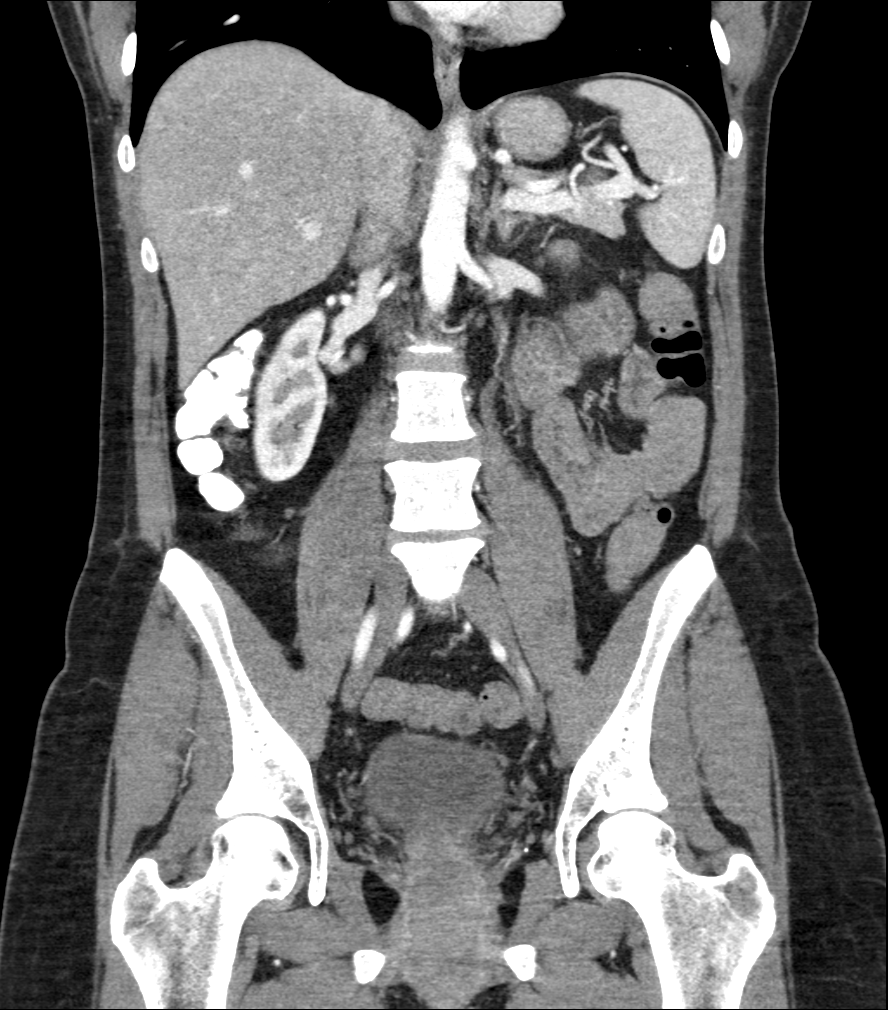
[im 48/87  soft-tissue]
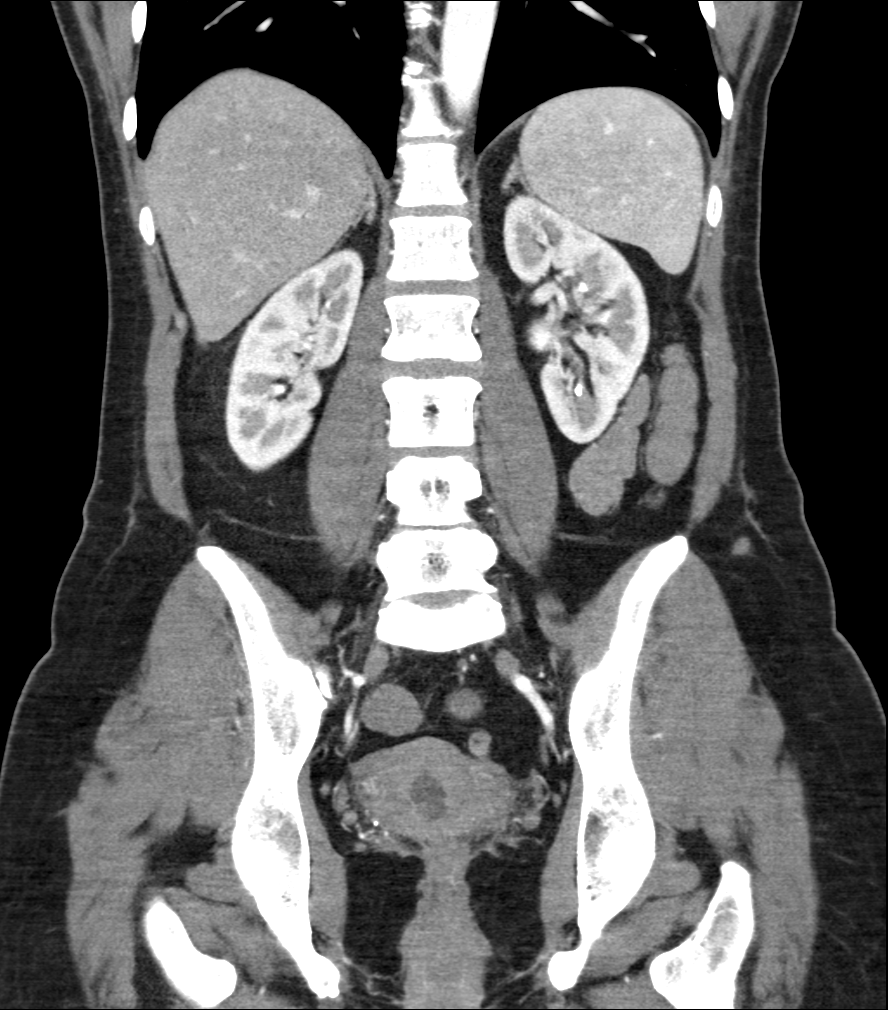

[10 of 46 positions shown; findings below may reference images not displayed]

FINDINGS: Lower chest: There is slight bibasilar lung atelectatic change. Lung
bases are otherwise clear.

Hepatobiliary: There is a 1.6 x 1.6 cm mass in the dome of the liver
in the medial segment left lobe region, likely a hemangioma. There
is a second mass in the liver, more inferiorly located in the
lateral segment left lobe measuring 1.4 x 1.0 cm. This mass appears
unchanged on slightly delayed images compared to the venous phase
images initially obtained. No other focal liver lesions are
identified. Gallbladder wall is not thickened. There is no biliary
duct dilatation.

Pancreas: No mass or inflammatory change.

Spleen: No lesion identified.

Adrenals/Urinary Tract: Adrenals appear normal bilaterally. Kidneys
bilaterally show no mass or hydronephrosis on either side. There is
no renal or ureteral calculus on either side. Urinary bladder is
midline with normal wall thickness.

Stomach/Bowel: Most bowel loops are fluid filled. There is no bowel
wall or mesenteric thickening. No bowel obstruction. No free air or
portal venous air. There are scattered sigmoid and descending
colonic diverticula without diverticulitis.

Vascular/Lymphatic: There is no abdominal aortic aneurysm. There is
no adenopathy in the abdomen or pelvis.

Reproductive: Uterus by report is absent. There is prominence in the
region of the cervix with fluid either in the cervix or in a
residual lower uterine segmental remnant. There is no well-defined
pelvic mass or pelvic fluid collection.

Other: Appendix appears unremarkable. No abscess or ascites in the
abdomen or pelvis. There is a rather minimal ventral hernia
containing only fat.

Musculoskeletal: There are no blastic or lytic bone lesions. No
intramuscular lesion or abdominal wall lesion.
IMPRESSION: Fluid either within the endocervical canal or within a lower uterine
segmental remnant. Correlation with pelvic examination advised given
this appearance.

Most bowel loops are fluid-filled. This finding may be seen normally
but also may be seen with a degree of enteritis. Note that there is
no bowel wall or mesenteric thickening. No bowel obstruction.

Liver lesions, most likely representing hemangiomas. As there are no
prior studies to compare, it may be reasonable to correlate with pre
and serial post-contrast CT or MR to further evaluate these lesions.
MR would be the optimum study for this evaluation.

No renal or ureteral calculus.  No hydronephrosis.

There is a minimal ventral hernia containing only fat.

## 2016-11-04 ENCOUNTER — Ambulatory Visit: Payer: PRIVATE HEALTH INSURANCE | Admitting: Sports Medicine

## 2016-11-18 ENCOUNTER — Encounter: Payer: Self-pay | Admitting: Sports Medicine

## 2016-11-18 ENCOUNTER — Ambulatory Visit (INDEPENDENT_AMBULATORY_CARE_PROVIDER_SITE_OTHER): Payer: PRIVATE HEALTH INSURANCE | Admitting: Sports Medicine

## 2016-11-18 DIAGNOSIS — R5383 Other fatigue: Secondary | ICD-10-CM

## 2016-11-18 DIAGNOSIS — R251 Tremor, unspecified: Secondary | ICD-10-CM

## 2016-11-18 DIAGNOSIS — F411 Generalized anxiety disorder: Secondary | ICD-10-CM | POA: Diagnosis not present

## 2016-11-18 DIAGNOSIS — M25532 Pain in left wrist: Secondary | ICD-10-CM

## 2016-11-18 DIAGNOSIS — G5600 Carpal tunnel syndrome, unspecified upper limb: Secondary | ICD-10-CM | POA: Insufficient documentation

## 2016-11-18 DIAGNOSIS — G5603 Carpal tunnel syndrome, bilateral upper limbs: Secondary | ICD-10-CM

## 2016-11-18 NOTE — Assessment & Plan Note (Signed)
Her symptoms may relate to hormonal fluctuation  We will try cock-up splint on the left  See if this is helping  If symptoms persist we need to do an ultrasound to assess her median nerve

## 2016-11-18 NOTE — Assessment & Plan Note (Signed)
This is clearly improved I think treating the hot flash symptoms with hormones has allowed her to sleep better She is less fatigued when she does get normal sleep  Laboratory evaluation is unremarkable  I encouraged keeping her on a moderate exercise program

## 2016-11-18 NOTE — Assessment & Plan Note (Signed)
Tremor has resolved and I think it may have been a ltremor 2/2 muscle fatigue

## 2016-11-18 NOTE — Progress Notes (Signed)
Chief complaint - muscle fatigue  [facility] [place] [facility] [place] She had an evaluation that included labs for CBC, chemistry panel, thyroid and Lyme disease titers  Since her last visit with may she return to [place] to [place] pellets replaced  This actually has really helped  she's not having hot flashes and sleeps through the night Her family stresses are also somewhat less  She did restart going to the gym to do weight work 3 times a week Her muscles feel stronger with less fasciculations  Keep problem today she still gets some hand stiffness and tingling in her fingers This is worse at night and first thing in the morning Also effects the wrists  ROS No recent fever or illness No cardiac symptoms No GI symptoms  Physical examination Patient appears less stressed and is in no acute distress BP 104/70   Ht 5\' 8"  (1.727 m)   Wt 165 lb (74.8 kg)   BMI 25.09 kg/m   Muscle evaluation shows no direct tenderness Trapezius spasm has resolved No fasciculations  She has an increased range of motion of both shoulder joints and the knee joints  Hands are evaluated and she does not have current positive findings for carpal tunnel There is no puffiness of the MCP joints

## 2016-11-18 NOTE — Assessment & Plan Note (Signed)
She has stopped taking Xanax as she is now sleeping through the night again  She still has a stressful home environment but is coping with this better

## 2017-02-01 ENCOUNTER — Ambulatory Visit (INDEPENDENT_AMBULATORY_CARE_PROVIDER_SITE_OTHER): Payer: PRIVATE HEALTH INSURANCE | Admitting: Podiatry

## 2017-02-01 ENCOUNTER — Encounter: Payer: Self-pay | Admitting: Podiatry

## 2017-02-01 VITALS — BP 116/74 | HR 69 | Ht 68.0 in | Wt 165.0 lb

## 2017-02-01 DIAGNOSIS — M21969 Unspecified acquired deformity of unspecified lower leg: Secondary | ICD-10-CM | POA: Diagnosis not present

## 2017-02-01 DIAGNOSIS — M7742 Metatarsalgia, left foot: Secondary | ICD-10-CM

## 2017-02-01 DIAGNOSIS — M216X2 Other acquired deformities of left foot: Secondary | ICD-10-CM | POA: Diagnosis not present

## 2017-02-01 DIAGNOSIS — M216X1 Other acquired deformities of right foot: Secondary | ICD-10-CM

## 2017-02-01 NOTE — Patient Instructions (Signed)
Seen for pain in left foot. Reviewed clinical and radiographic findings. Injection given to left forefoot 4th intermetatarsal area. Both feet casted for orthotics.

## 2017-02-01 NOTE — Progress Notes (Signed)
SUBJECTIVE: 46 y.o. year old female presents complaining of pain in left foot. Had a 3 mile run 3 months ago, which cause left foot pain and still hurts. Pain is not getting any worse but hurts if start going out for a walk. Has desk job but being very active.  Positive of plantar fasciitis last Summer duration of a year and got over it with good pair of sandals helped. Patient is using OTC orthotics since last summer.   Joints easily cracking with occasional pain in wrist and foot.  REVIEW OF SYSTEMS: Pertinent items noted in HPI and remainder of comprehensive ROS otherwise negative.  OBJECTIVE: DERMATOLOGIC EXAMINATION: Normal nails without abnormal skin lesions. VASCULAR EXAMINATION OF LOWER LIMBS: All pedal pulses are palpable with normal pulsation.  No visible edema or erythema noted on left foot.  Temperature gradient from tibial crest to dorsum of foot is within normal bilateral.  NEUROLOGIC EXAMINATION OF THE LOWER LIMBS: All epicritic and tactile sensations are grossly intact.  MUSCULOSKELETAL EXAMINATION: Positive for some tightness of Achilles tendon on both feet. Excess sagittal plane motion of the first ray bilateral. Pain along the lateral 1/2 of left forefoot.  RADIOGRAPHIC STUDIES:  General overview: AP View:  Rectus foot without gross deformity. Lateral view:  Mild first ray elevation on left foot.  ASSESSMENT: Hypermobile first ray bilateral. Lesser metatarsalgia left. Ankle equinus bilateral. Forefoot varus bilateral.  PLAN: Reviewed clinical findings and available treatment options, NSAIA, injection, change in shoe gear, activity change, custom orthotics. Reviewed daily stretch exercise for tight Achilles tendon. As per request, left forefoot 4th intermetatarsal space Injected with mixture of 4 mg Dexamethasone, 4 mg Triamcinolone, and 1 cc of 0.5% Marcaine plain.  Patient tolerated well without difficulty.  As per request, both feet casted for Custom  orthotics.

## 2017-04-08 ENCOUNTER — Ambulatory Visit: Payer: PRIVATE HEALTH INSURANCE | Admitting: Podiatry
